# Patient Record
Sex: Male | Born: 1970 | Hispanic: No | Smoking: Never smoker
Health system: Southern US, Community
[De-identification: ages and names within clinical notes are randomized; demographics above are authoritative.]

## PROBLEM LIST (undated history)

## (undated) DIAGNOSIS — F419 Anxiety disorder, unspecified: Secondary | ICD-10-CM

## (undated) DIAGNOSIS — M1990 Unspecified osteoarthritis, unspecified site: Secondary | ICD-10-CM

## (undated) DIAGNOSIS — F329 Major depressive disorder, single episode, unspecified: Secondary | ICD-10-CM

## (undated) DIAGNOSIS — I10 Essential (primary) hypertension: Secondary | ICD-10-CM

## (undated) DIAGNOSIS — Z9889 Other specified postprocedural states: Secondary | ICD-10-CM

## (undated) DIAGNOSIS — F32A Depression, unspecified: Secondary | ICD-10-CM

## (undated) DIAGNOSIS — J189 Pneumonia, unspecified organism: Secondary | ICD-10-CM

## (undated) DIAGNOSIS — Z87442 Personal history of urinary calculi: Secondary | ICD-10-CM

## (undated) DIAGNOSIS — G4730 Sleep apnea, unspecified: Secondary | ICD-10-CM

## (undated) DIAGNOSIS — R112 Nausea with vomiting, unspecified: Secondary | ICD-10-CM

## (undated) HISTORY — PX: IRRIGATION AND DEBRIDEMENT SEBACEOUS CYST: SHX5255

## (undated) HISTORY — PX: WISDOM TOOTH EXTRACTION: SHX21

## (undated) HISTORY — PX: ESOPHAGOGASTRODUODENOSCOPY: SHX1529

## (undated) HISTORY — PX: COLONOSCOPY: SHX174

## (undated) HISTORY — PX: GASTRIC BYPASS: SHX52

---

## 2010-09-02 ENCOUNTER — Emergency Department (HOSPITAL_BASED_OUTPATIENT_CLINIC_OR_DEPARTMENT_OTHER)
Admission: EM | Admit: 2010-09-02 | Discharge: 2010-09-02 | Payer: Self-pay | Source: Home / Self Care | Admitting: Emergency Medicine

## 2011-09-07 ENCOUNTER — Emergency Department (HOSPITAL_BASED_OUTPATIENT_CLINIC_OR_DEPARTMENT_OTHER)
Admission: EM | Admit: 2011-09-07 | Discharge: 2011-09-07 | Disposition: A | Discharge status: Home / Self Care | Payer: BC Managed Care – PPO | Attending: Emergency Medicine | Admitting: Emergency Medicine

## 2011-09-07 ENCOUNTER — Emergency Department (INDEPENDENT_AMBULATORY_CARE_PROVIDER_SITE_OTHER): Payer: BC Managed Care – PPO

## 2011-09-07 ENCOUNTER — Encounter (HOSPITAL_BASED_OUTPATIENT_CLINIC_OR_DEPARTMENT_OTHER): Payer: Self-pay | Admitting: *Deleted

## 2011-09-07 ENCOUNTER — Other Ambulatory Visit: Payer: Self-pay

## 2011-09-07 DIAGNOSIS — R0989 Other specified symptoms and signs involving the circulatory and respiratory systems: Secondary | ICD-10-CM | POA: Insufficient documentation

## 2011-09-07 DIAGNOSIS — R0600 Dyspnea, unspecified: Secondary | ICD-10-CM

## 2011-09-07 DIAGNOSIS — R0609 Other forms of dyspnea: Secondary | ICD-10-CM | POA: Insufficient documentation

## 2011-09-07 DIAGNOSIS — X020XXA Exposure to flames in controlled fire in building or structure, initial encounter: Secondary | ICD-10-CM

## 2011-09-07 DIAGNOSIS — I10 Essential (primary) hypertension: Secondary | ICD-10-CM | POA: Insufficient documentation

## 2011-09-07 DIAGNOSIS — R0602 Shortness of breath: Secondary | ICD-10-CM | POA: Insufficient documentation

## 2011-09-07 DIAGNOSIS — Z79899 Other long term (current) drug therapy: Secondary | ICD-10-CM | POA: Insufficient documentation

## 2011-09-07 HISTORY — DX: Essential (primary) hypertension: I10
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
## 2011-09-07 NOTE — ED Provider Notes (Signed)
Medical screening examination/treatment/procedure(s) were performed by non-physician practitioner and as supervising physician I was immediately available for consultation/collaboration.   Leigh-Ann Sheba Whaling, MD 09/07/11 2358 
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
## 2017-12-31 ENCOUNTER — Other Ambulatory Visit: Payer: Self-pay | Admitting: Neurological Surgery

## 2018-01-08 NOTE — Pre-Procedure Instructions (Signed)
Steven Crosby  01/08/2018      [id number] - [place], [place] - [address] [place]  [address] Phone: [phone] Fax: [phone]  [facility] - [place], [place] - [address] [place] [address] Phone: [phone] Fax: [phone]  [facility] [address] [place] - [address] [place] [address] Phone: [phone] Fax: [phone]    Your procedure is scheduled on  Wednesday  01/14/18  Report to [facility] Admitting at 630 A.M.  Call this number if you have problems the morning of surgery:  [phone]   Remember:  No food  OR DRINK after midnight.     Take these medicines the morning of surgery with A SIP OF WATER   BUSPIRONE (BUSPAR), ESCITALOPRAM (LEXAPRO), GABAPENTIN, EYE DROPS, METOPROLOL (LOPRESSOR), OMEPRAZOLE (PRILOSEC)  7 days prior to surgery STOP taking any Aspirin(unless otherwise instructed by your surgeon), Aleve, Naproxen, Ibuprofen, Motrin, Advil, Goody's, BC's, all herbal medications, fish oil, and all vitamins    Do not wear jewelry, make-up or nail polish.  Do not wear lotions, powders, or perfumes, or deodorant.  Do not shave 48 hours prior to surgery.  Men may shave face and neck.  Do not bring valuables to the hospital.  [facility] is not responsible for any belongings or valuables.  Contacts, dentures or bridgework may not be worn into surgery.  Leave your suitcase in the car.  After surgery it may be brought to your room.  For patients admitted to the hospital, discharge time will be determined by your treatment team.  Patients discharged the day of surgery will not be allowed to drive home.   Name and phone number of your driver:    Special instructions:  Steven Crosby - Preparing for Surgery  Before surgery, you can play an important  role.  Because skin is not sterile, your skin needs to be as free of germs as possible.  You can reduce the number of germs on you skin by washing with CHG (chlorahexidine gluconate) soap before surgery.  CHG is an antiseptic cleaner which kills germs and bonds with the skin to continue killing germs even after washing.  Oral Hygiene is also important in reducing the risk of infection.  Remember to brush your teeth with your regular toothpaste the morning of surgery.  Please DO NOT use if you have an allergy to CHG or antibacterial soaps.  If your skin becomes reddened/irritated stop using the CHG and inform your nurse when you arrive at Short Stay.  Do not shave (including legs and underarms) for at least 48 hours prior to the first CHG shower.  You may shave your face.  Please follow these instructions carefully:   1.  Shower with CHG Soap the night before surgery and the morning of Surgery.  2.  If you choose to wash your hair, wash your hair first as usual with your normal shampoo.  3.  After you shampoo, rinse your hair and body thoroughly to remove the shampoo. 4.  Use CHG as you would any other liquid soap.  You can apply chg directly to the skin and wash gently with a      scrungie or washcloth.           5.  Apply the CHG  Soap to your body ONLY FROM THE NECK DOWN.   Do not use on open wounds or open sores. Avoid contact with your eyes, ears, mouth and genitals (private parts).  Wash genitals (private parts) with your normal soap.  6.  Wash thoroughly, paying special attention to the area where your surgery will be performed.  7.  Thoroughly rinse your body with warm water from the neck down.  8.  DO NOT shower/wash with your normal soap after using and rinsing off the CHG Soap.  9.  Pat yourself dry with a clean towel.            10.  Wear clean pajamas.            11.  Place clean sheets on your bed the night of your first shower and do not sleep with pets.  Day of Surgery  Do not  apply any lotions/deoderants the morning of surgery.   Please wear clean clothes to the hospital/surgery center. Remember to brush your teeth with toothpaste.     Please read over the following fact sheets that you were given. MRSA Information and Surgical Site Infection Prevention

## 2018-01-09 ENCOUNTER — Ambulatory Visit (HOSPITAL_COMMUNITY)
Admission: RE | Admit: 2018-01-09 | Discharge: 2018-01-09 | Disposition: A | Discharge status: Home / Self Care | Payer: Self-pay | Source: Ambulatory Visit | Attending: Neurological Surgery | Admitting: Neurological Surgery

## 2018-01-09 ENCOUNTER — Encounter (HOSPITAL_COMMUNITY)
Admission: RE | Admit: 2018-01-09 | Discharge: 2018-01-09 | Disposition: A | Discharge status: Home / Self Care | Payer: Worker's Compensation | Source: Ambulatory Visit | Attending: Neurological Surgery | Admitting: Neurological Surgery

## 2018-01-09 ENCOUNTER — Encounter (HOSPITAL_COMMUNITY): Payer: Self-pay

## 2018-01-09 ENCOUNTER — Other Ambulatory Visit: Payer: Self-pay

## 2018-01-09 DIAGNOSIS — Z01812 Encounter for preprocedural laboratory examination: Secondary | ICD-10-CM | POA: Insufficient documentation

## 2018-01-09 DIAGNOSIS — Z01810 Encounter for preprocedural cardiovascular examination: Secondary | ICD-10-CM | POA: Insufficient documentation

## 2018-01-09 DIAGNOSIS — M4802 Spinal stenosis, cervical region: Secondary | ICD-10-CM | POA: Insufficient documentation

## 2018-01-09 HISTORY — DX: Other specified postprocedural states: Z98.890

## 2018-01-09 HISTORY — DX: Unspecified osteoarthritis, unspecified site: M19.90

## 2018-01-09 HISTORY — DX: Depression, unspecified: F32.A

## 2018-01-09 HISTORY — DX: Pneumonia, unspecified organism: J18.9

## 2018-01-09 HISTORY — DX: Other specified postprocedural states: R11.2

## 2018-01-09 HISTORY — DX: Anxiety disorder, unspecified: F41.9

## 2018-01-09 HISTORY — DX: Personal history of urinary calculi: Z87.442

## 2018-01-09 HISTORY — DX: Sleep apnea, unspecified: G47.30

## 2018-01-09 HISTORY — DX: Major depressive disorder, single episode, unspecified: F32.9

## 2018-01-09 LAB — CBC WITH DIFFERENTIAL/PLATELET
ABS IMMATURE GRANULOCYTES: 0 10*3/uL (ref 0.0–0.1)
Basophils Absolute: 0.1 10*3/uL (ref 0.0–0.1)
Basophils Relative: 1 %
EOS ABS: 0.1 10*3/uL (ref 0.0–0.7)
Eosinophils Relative: 2 %
HEMATOCRIT: 42 % (ref 39.0–52.0)
Hemoglobin: 13.8 g/dL (ref 13.0–17.0)
IMMATURE GRANULOCYTES: 0 %
LYMPHS ABS: 2.2 10*3/uL (ref 0.7–4.0)
Lymphocytes Relative: 24 %
MCH: 28.3 pg (ref 26.0–34.0)
MCHC: 32.9 g/dL (ref 30.0–36.0)
MCV: 86.1 fL (ref 78.0–100.0)
MONOS PCT: 7 %
Monocytes Absolute: 0.7 10*3/uL (ref 0.1–1.0)
NEUTROS ABS: 5.9 10*3/uL (ref 1.7–7.7)
NEUTROS PCT: 66 %
Platelets: 292 10*3/uL (ref 150–400)
RBC: 4.88 MIL/uL (ref 4.22–5.81)
RDW: 13.5 % (ref 11.5–15.5)
WBC: 8.9 10*3/uL (ref 4.0–10.5)

## 2018-01-09 LAB — BASIC METABOLIC PANEL
Anion gap: 9 (ref 5–15)
BUN: 15 mg/dL (ref 6–20)
CHLORIDE: 107 mmol/L (ref 101–111)
CO2: 23 mmol/L (ref 22–32)
CREATININE: 0.79 mg/dL (ref 0.61–1.24)
Calcium: 8.9 mg/dL (ref 8.9–10.3)
GFR calc non Af Amer: >60 mL/min (ref >60)
Glucose, Bld: 82 mg/dL (ref 65–99)
Potassium: 4.1 mmol/L (ref 3.5–5.1)
Sodium: 139 mmol/L (ref 135–145)

## 2018-01-09 LAB — SURGICAL PCR SCREEN
MRSA, PCR: NEGATIVE
Staphylococcus aureus: NEGATIVE

## 2018-01-09 LAB — PROTIME-INR
INR: 1.04
Prothrombin Time: 13.5 seconds (ref 11.4–15.2)

## 2018-01-09 NOTE — Progress Notes (Addendum)
PCP is Dr Daphine Deutscher at [facility] States he saw a cardiologist many years ago and only wore a heart monitor states everything was fine - Dr Desma Maxim Denies chest pain,cough, or fever. Instructed to bring in CPAP mask on the day of surgery. Denies ever having a card cath, stress test, or echo.

## 2018-01-13 MED ORDER — DEXTROSE 5 % IV SOLN
3.0000 g | INTRAVENOUS | Status: AC
  Administered 2018-01-14: 3 g via INTRAVENOUS
  Filled 2018-01-13: qty 3

## 2018-01-14 ENCOUNTER — Observation Stay (HOSPITAL_COMMUNITY)
Admission: RE | Admit: 2018-01-14 | Discharge: 2018-01-14 | Disposition: A | Discharge status: Home / Self Care | Payer: Worker's Compensation | Source: Ambulatory Visit | Attending: Neurological Surgery | Admitting: Neurological Surgery

## 2018-01-14 ENCOUNTER — Other Ambulatory Visit: Payer: Self-pay

## 2018-01-14 ENCOUNTER — Ambulatory Visit (HOSPITAL_COMMUNITY): Payer: Worker's Compensation | Admitting: Certified Registered Nurse Anesthetist

## 2018-01-14 ENCOUNTER — Encounter (HOSPITAL_COMMUNITY): Payer: Self-pay | Admitting: *Deleted

## 2018-01-14 ENCOUNTER — Ambulatory Visit (HOSPITAL_COMMUNITY): Payer: Worker's Compensation

## 2018-01-14 ENCOUNTER — Encounter (HOSPITAL_COMMUNITY)
Admission: RE | Disposition: A | Discharge status: Home / Self Care | Payer: Self-pay | Source: Ambulatory Visit | Attending: Neurological Surgery

## 2018-01-14 ENCOUNTER — Ambulatory Visit (HOSPITAL_COMMUNITY): Payer: Worker's Compensation | Admitting: Emergency Medicine

## 2018-01-14 DIAGNOSIS — F419 Anxiety disorder, unspecified: Secondary | ICD-10-CM | POA: Insufficient documentation

## 2018-01-14 DIAGNOSIS — Z87891 Personal history of nicotine dependence: Secondary | ICD-10-CM | POA: Diagnosis not present

## 2018-01-14 DIAGNOSIS — Z79899 Other long term (current) drug therapy: Secondary | ICD-10-CM | POA: Insufficient documentation

## 2018-01-14 DIAGNOSIS — F329 Major depressive disorder, single episode, unspecified: Secondary | ICD-10-CM | POA: Diagnosis not present

## 2018-01-14 DIAGNOSIS — G4730 Sleep apnea, unspecified: Secondary | ICD-10-CM | POA: Insufficient documentation

## 2018-01-14 DIAGNOSIS — I10 Essential (primary) hypertension: Secondary | ICD-10-CM | POA: Diagnosis not present

## 2018-01-14 DIAGNOSIS — M4722 Other spondylosis with radiculopathy, cervical region: Secondary | ICD-10-CM | POA: Insufficient documentation

## 2018-01-14 DIAGNOSIS — M50123 Cervical disc disorder at C6-C7 level with radiculopathy: Principal | ICD-10-CM | POA: Insufficient documentation

## 2018-01-14 DIAGNOSIS — M4322 Fusion of spine, cervical region: Secondary | ICD-10-CM | POA: Diagnosis present

## 2018-01-14 DIAGNOSIS — Z419 Encounter for procedure for purposes other than remedying health state, unspecified: Secondary | ICD-10-CM

## 2018-01-14 HISTORY — PX: ANTERIOR CERVICAL DECOMP/DISCECTOMY FUSION: SHX1161

## 2018-01-14 SURGERY — ANTERIOR CERVICAL DECOMPRESSION/DISCECTOMY FUSION 2 LEVELS
Anesthesia: General | Site: Spine Cervical

## 2018-01-14 MED ORDER — SODIUM CHLORIDE 0.9 % IV SOLN: 250.0000 mL | INTRAVENOUS | Status: DC

## 2018-01-14 MED ORDER — MIDAZOLAM HCL 5 MG/5ML IJ SOLN
INTRAMUSCULAR | Status: DC | PRN
  Administered 2018-01-14: 2 mg via INTRAVENOUS

## 2018-01-14 MED ORDER — GLYCOPYRROLATE PF 0.2 MG/ML IJ SOSY
PREFILLED_SYRINGE | INTRAMUSCULAR | Status: DC | PRN
  Administered 2018-01-14: 0.8 mg via INTRAVENOUS

## 2018-01-14 MED ORDER — THROMBIN 5000 UNITS EX SOLR
CUTANEOUS | Status: AC
  Filled 2018-01-14: qty 5000

## 2018-01-14 MED ORDER — BUPIVACAINE HCL (PF) 0.25 % IJ SOLN
INTRAMUSCULAR | Status: AC
  Filled 2018-01-14: qty 30

## 2018-01-14 MED ORDER — LIDOCAINE 2% (20 MG/ML) 5 ML SYRINGE
INTRAMUSCULAR | Status: AC
  Filled 2018-01-14: qty 5

## 2018-01-14 MED ORDER — METHOCARBAMOL 1000 MG/10ML IJ SOLN
500.0000 mg | Freq: Four times a day (QID) | INTRAMUSCULAR | Status: DC | PRN
  Filled 2018-01-14: qty 5

## 2018-01-14 MED ORDER — SODIUM CHLORIDE 0.9% FLUSH: 3.0000 mL | INTRAVENOUS | Status: DC | PRN

## 2018-01-14 MED ORDER — FENTANYL CITRATE (PF) 250 MCG/5ML IJ SOLN
INTRAMUSCULAR | Status: AC
  Filled 2018-01-14: qty 5

## 2018-01-14 MED ORDER — ESCITALOPRAM OXALATE 20 MG PO TABS: 20.0000 mg | ORAL_TABLET | Freq: Every day | ORAL | Status: DC

## 2018-01-14 MED ORDER — ONDANSETRON HCL 4 MG PO TABS: 4.0000 mg | ORAL_TABLET | Freq: Four times a day (QID) | ORAL | Status: DC | PRN

## 2018-01-14 MED ORDER — MENTHOL 3 MG MT LOZG: 1.0000 | LOZENGE | OROMUCOSAL | Status: DC | PRN

## 2018-01-14 MED ORDER — CEFAZOLIN SODIUM-DEXTROSE 2-4 GM/100ML-% IV SOLN
2.0000 g | Freq: Three times a day (TID) | INTRAVENOUS | Status: DC
  Administered 2018-01-14: 2 g via INTRAVENOUS
  Filled 2018-01-14: qty 100

## 2018-01-14 MED ORDER — PROPOFOL 10 MG/ML IV BOLUS
INTRAVENOUS | Status: DC | PRN
  Administered 2018-01-14: 200 mg via INTRAVENOUS

## 2018-01-14 MED ORDER — NEOSTIGMINE METHYLSULFATE 5 MG/5ML IV SOSY
PREFILLED_SYRINGE | INTRAVENOUS | Status: AC
  Filled 2018-01-14: qty 5

## 2018-01-14 MED ORDER — PROPOFOL 10 MG/ML IV BOLUS
INTRAVENOUS | Status: AC
  Filled 2018-01-14: qty 40

## 2018-01-14 MED ORDER — OXYCODONE HCL 5 MG PO TABS: 5.0000 mg | ORAL_TABLET | Freq: Once | ORAL | Status: DC | PRN

## 2018-01-14 MED ORDER — HYDROMORPHONE HCL 1 MG/ML IJ SOLN
0.5000 mg | INTRAMUSCULAR | Status: DC | PRN
  Administered 2018-01-14: 0.5 mg via INTRAVENOUS
  Filled 2018-01-14: qty 0.5

## 2018-01-14 MED ORDER — ONDANSETRON HCL 4 MG/2ML IJ SOLN: 4.0000 mg | Freq: Four times a day (QID) | INTRAMUSCULAR | Status: DC | PRN

## 2018-01-14 MED ORDER — DEXAMETHASONE SODIUM PHOSPHATE 10 MG/ML IJ SOLN
INTRAMUSCULAR | Status: AC
  Filled 2018-01-14: qty 1

## 2018-01-14 MED ORDER — SODIUM CHLORIDE 0.9 % IV SOLN
INTRAVENOUS | Status: DC | PRN
  Administered 2018-01-14: 500 mL

## 2018-01-14 MED ORDER — BUPIVACAINE HCL (PF) 0.25 % IJ SOLN
INTRAMUSCULAR | Status: DC | PRN
  Administered 2018-01-14: 7 mL

## 2018-01-14 MED ORDER — ACETAMINOPHEN 325 MG PO TABS: 325.0000 mg | ORAL_TABLET | ORAL | Status: DC | PRN

## 2018-01-14 MED ORDER — DEXAMETHASONE SODIUM PHOSPHATE 10 MG/ML IJ SOLN
INTRAMUSCULAR | Status: DC | PRN
  Administered 2018-01-14: 10 mg via INTRAVENOUS

## 2018-01-14 MED ORDER — ACETAMINOPHEN 160 MG/5ML PO SOLN: 325.0000 mg | ORAL | Status: DC | PRN

## 2018-01-14 MED ORDER — SENNA 8.6 MG PO TABS: 1.0000 | ORAL_TABLET | Freq: Two times a day (BID) | ORAL | Status: DC

## 2018-01-14 MED ORDER — PHENOL 1.4 % MT LIQD: 1.0000 | OROMUCOSAL | Status: DC | PRN

## 2018-01-14 MED ORDER — MIDAZOLAM HCL 2 MG/2ML IJ SOLN
INTRAMUSCULAR | Status: AC
  Filled 2018-01-14: qty 2

## 2018-01-14 MED ORDER — METOPROLOL TARTRATE 100 MG PO TABS: 100.0000 mg | ORAL_TABLET | Freq: Every day | ORAL | Status: DC

## 2018-01-14 MED ORDER — EPHEDRINE SULFATE 50 MG/ML IJ SOLN
INTRAMUSCULAR | Status: AC
  Filled 2018-01-14: qty 1

## 2018-01-14 MED ORDER — POTASSIUM CHLORIDE IN NACL 20-0.9 MEQ/L-% IV SOLN: INTRAVENOUS | Status: DC

## 2018-01-14 MED ORDER — ACETAMINOPHEN 325 MG PO TABS: 650.0000 mg | ORAL_TABLET | ORAL | Status: DC | PRN

## 2018-01-14 MED ORDER — METHOCARBAMOL 500 MG PO TABS
500.0000 mg | ORAL_TABLET | Freq: Four times a day (QID) | ORAL | Status: DC | PRN
  Administered 2018-01-14: 500 mg via ORAL
  Filled 2018-01-14: qty 1

## 2018-01-14 MED ORDER — LACTATED RINGERS IV SOLN
INTRAVENOUS | Status: DC | PRN
  Administered 2018-01-14 (×2): via INTRAVENOUS

## 2018-01-14 MED ORDER — ONDANSETRON HCL 4 MG/2ML IJ SOLN
INTRAMUSCULAR | Status: DC | PRN
  Administered 2018-01-14: 4 mg via INTRAVENOUS

## 2018-01-14 MED ORDER — CEFAZOLIN SODIUM-DEXTROSE 2-4 GM/100ML-% IV SOLN
INTRAVENOUS | Status: AC
  Filled 2018-01-14: qty 100

## 2018-01-14 MED ORDER — NEOSTIGMINE METHYLSULFATE 5 MG/5ML IV SOSY
PREFILLED_SYRINGE | INTRAVENOUS | Status: DC | PRN
  Administered 2018-01-14: 5 mg via INTRAVENOUS

## 2018-01-14 MED ORDER — HYDROCODONE-ACETAMINOPHEN 10-325 MG PO TABS
1.0000 | ORAL_TABLET | ORAL | Status: DC | PRN
  Administered 2018-01-14: 1 via ORAL
  Filled 2018-01-14 (×2): qty 1

## 2018-01-14 MED ORDER — BUSPIRONE HCL 10 MG PO TABS: 10.0000 mg | ORAL_TABLET | Freq: Every day | ORAL | Status: DC

## 2018-01-14 MED ORDER — OXYCODONE HCL 5 MG/5ML PO SOLN: 5.0000 mg | Freq: Once | ORAL | Status: DC | PRN

## 2018-01-14 MED ORDER — GABAPENTIN 300 MG PO CAPS
300.0000 mg | ORAL_CAPSULE | Freq: Three times a day (TID) | ORAL | Status: DC
  Administered 2018-01-14: 300 mg via ORAL
  Filled 2018-01-14: qty 1

## 2018-01-14 MED ORDER — 0.9 % SODIUM CHLORIDE (POUR BTL) OPTIME
TOPICAL | Status: DC | PRN
  Administered 2018-01-14: 1000 mL

## 2018-01-14 MED ORDER — SODIUM CHLORIDE 0.9% FLUSH
3.0000 mL | Freq: Two times a day (BID) | INTRAVENOUS | Status: DC
  Administered 2018-01-14: 3 mL via INTRAVENOUS

## 2018-01-14 MED ORDER — EPHEDRINE SULFATE 50 MG/ML IJ SOLN
INTRAMUSCULAR | Status: DC | PRN
  Administered 2018-01-14 (×2): 10 mg via INTRAVENOUS

## 2018-01-14 MED ORDER — ROCURONIUM BROMIDE 10 MG/ML (PF) SYRINGE
PREFILLED_SYRINGE | INTRAVENOUS | Status: AC
  Filled 2018-01-14: qty 10

## 2018-01-14 MED ORDER — LIDOCAINE 2% (20 MG/ML) 5 ML SYRINGE
INTRAMUSCULAR | Status: DC | PRN
  Administered 2018-01-14: 40 mg via INTRAVENOUS
  Administered 2018-01-14: 100 mg via INTRAVENOUS

## 2018-01-14 MED ORDER — ACETAMINOPHEN 650 MG RE SUPP: 650.0000 mg | RECTAL | Status: DC | PRN

## 2018-01-14 MED ORDER — FENTANYL CITRATE (PF) 100 MCG/2ML IJ SOLN
25.0000 ug | INTRAMUSCULAR | Status: DC | PRN
  Administered 2018-01-14 (×2): 25 ug via INTRAVENOUS

## 2018-01-14 MED ORDER — FENTANYL CITRATE (PF) 100 MCG/2ML IJ SOLN
INTRAMUSCULAR | Status: DC | PRN
  Administered 2018-01-14: 100 ug via INTRAVENOUS
  Administered 2018-01-14: 50 ug via INTRAVENOUS
  Administered 2018-01-14: 100 ug via INTRAVENOUS
  Administered 2018-01-14 (×3): 50 ug via INTRAVENOUS

## 2018-01-14 MED ORDER — HYDROCODONE-ACETAMINOPHEN 10-325 MG PO TABS
1.0000 | ORAL_TABLET | ORAL | Status: DC | PRN
  Administered 2018-01-14: 1 via ORAL

## 2018-01-14 MED ORDER — ONDANSETRON HCL 4 MG/2ML IJ SOLN
INTRAMUSCULAR | Status: AC
  Filled 2018-01-14: qty 2

## 2018-01-14 MED ORDER — MODAFINIL 200 MG PO TABS: 200.0000 mg | ORAL_TABLET | Freq: Every day | ORAL | Status: DC

## 2018-01-14 MED ORDER — FENTANYL CITRATE (PF) 100 MCG/2ML IJ SOLN
INTRAMUSCULAR | Status: AC
  Filled 2018-01-14: qty 2

## 2018-01-14 MED ORDER — ROCURONIUM BROMIDE 10 MG/ML (PF) SYRINGE
PREFILLED_SYRINGE | INTRAVENOUS | Status: DC | PRN
  Administered 2018-01-14: 70 mg via INTRAVENOUS
  Administered 2018-01-14: 30 mg via INTRAVENOUS

## 2018-01-14 MED ORDER — THROMBIN 5000 UNITS EX SOLR
OROMUCOSAL | Status: DC | PRN
  Administered 2018-01-14: 5 mL via TOPICAL

## 2018-01-14 SURGICAL SUPPLY — 52 items
BAG DECANTER FOR FLEXI CONT (MISCELLANEOUS) ×3 IMPLANT
BASKET BONE COLLECTION (BASKET) ×3 IMPLANT
BENZOIN TINCTURE PRP APPL 2/3 (GAUZE/BANDAGES/DRESSINGS) ×3 IMPLANT
BIT DRILL 14MM (INSTRUMENTS) ×1 IMPLANT
BUR MATCHSTICK NEURO 3.0 LAGG (BURR) ×3 IMPLANT
CANISTER SUCT 3000ML PPV (MISCELLANEOUS) ×3 IMPLANT
CARTRIDGE OIL MAESTRO DRILL (MISCELLANEOUS) ×1 IMPLANT
CLOSURE WOUND 1/2 X4 (GAUZE/BANDAGES/DRESSINGS) ×1
DIFFUSER DRILL AIR PNEUMATIC (MISCELLANEOUS) ×3 IMPLANT
DRAPE C-ARM 42X72 X-RAY (DRAPES) ×6 IMPLANT
DRAPE LAPAROTOMY 100X72 PEDS (DRAPES) ×3 IMPLANT
DRAPE MICROSCOPE LEICA (MISCELLANEOUS) ×3 IMPLANT
DRILL 14MM (INSTRUMENTS) ×3
DRSG OPSITE POSTOP 4X6 (GAUZE/BANDAGES/DRESSINGS) ×3 IMPLANT
DURAPREP 6ML APPLICATOR 50/CS (WOUND CARE) ×3 IMPLANT
ELECT COATED BLADE 2.86 ST (ELECTRODE) ×3 IMPLANT
ELECT REM PT RETURN 9FT ADLT (ELECTROSURGICAL) ×3
ELECTRODE REM PT RTRN 9FT ADLT (ELECTROSURGICAL) ×1 IMPLANT
GAUZE SPONGE 4X4 16PLY XRAY LF (GAUZE/BANDAGES/DRESSINGS) IMPLANT
GLOVE BIO SURGEON STRL SZ7 (GLOVE) ×3 IMPLANT
GLOVE BIO SURGEON STRL SZ8 (GLOVE) ×3 IMPLANT
GLOVE BIOGEL PI IND STRL 7.0 (GLOVE) ×1 IMPLANT
GLOVE BIOGEL PI INDICATOR 7.0 (GLOVE) ×2
GLOVE SURG SS PI 7.5 STRL IVOR (GLOVE) ×15 IMPLANT
GOWN STRL REUS W/ TWL LRG LVL3 (GOWN DISPOSABLE) ×3 IMPLANT
GOWN STRL REUS W/ TWL XL LVL3 (GOWN DISPOSABLE) ×1 IMPLANT
GOWN STRL REUS W/TWL 2XL LVL3 (GOWN DISPOSABLE) IMPLANT
GOWN STRL REUS W/TWL LRG LVL3 (GOWN DISPOSABLE) ×6
GOWN STRL REUS W/TWL XL LVL3 (GOWN DISPOSABLE) ×2
HEMOSTAT POWDER KIT SURGIFOAM (HEMOSTASIS) ×3 IMPLANT
KIT BASIN OR (CUSTOM PROCEDURE TRAY) ×3 IMPLANT
KIT TURNOVER KIT B (KITS) ×3 IMPLANT
NEEDLE HYPO 25X1 1.5 SAFETY (NEEDLE) ×3 IMPLANT
NEEDLE SPNL 20GX3.5 QUINCKE YW (NEEDLE) ×3 IMPLANT
NS IRRIG 1000ML POUR BTL (IV SOLUTION) ×3 IMPLANT
OIL CARTRIDGE MAESTRO DRILL (MISCELLANEOUS) ×3
PACK LAMINECTOMY NEURO (CUSTOM PROCEDURE TRAY) ×3 IMPLANT
PAD ARMBOARD 7.5X6 YLW CONV (MISCELLANEOUS) ×12 IMPLANT
PIN DISTRACTION 14MM (PIN) ×6 IMPLANT
PLATE 34MM (Plate) ×3 IMPLANT
RUBBERBAND STERILE (MISCELLANEOUS) ×6 IMPLANT
SCREW 16MM (Screw) ×18 IMPLANT
SPACER PTI-C 7X16X14 IDENTI (Spacer) ×3 IMPLANT
SPACER PTI-C 8X16X14 7DEG (Spacer) ×3 IMPLANT
SPONGE INTESTINAL PEANUT (DISPOSABLE) ×3 IMPLANT
SPONGE SURGIFOAM ABS GEL SZ50 (HEMOSTASIS) IMPLANT
STRIP CLOSURE SKIN 1/2X4 (GAUZE/BANDAGES/DRESSINGS) ×2 IMPLANT
SUT VIC AB 3-0 SH 8-18 (SUTURE) ×6 IMPLANT
SUT VIC AB 4-0 PS2 27 (SUTURE) ×3 IMPLANT
TOWEL GREEN STERILE (TOWEL DISPOSABLE) ×3 IMPLANT
TOWEL GREEN STERILE FF (TOWEL DISPOSABLE) ×3 IMPLANT
WATER STERILE IRR 1000ML POUR (IV SOLUTION) ×3 IMPLANT

## 2018-01-14 NOTE — Discharge Summary (Signed)
Physician Discharge Summary  Patient ID: Steven ClinesScott R Crosby MRN: [id number] DOB/AGE: 47/04/1971 47 y.o.  Admit date: 01/14/2018 Discharge date: 01/14/2018  Admission Diagnoses:cervical spondylosis    Discharge Diagnoses: same   Discharged Condition: good  Hospital Course: The patient was admitted on 01/14/2018 and taken to the operating room where the patient underwent acdf c5-6, 6-7. The patient tolerated the procedure well and was taken to the recovery room and then to the floor in stable condition. The hospital course was routine. There were no complications. The wound remained clean dry and intact. Pt had appropriate back soreness. No complaints of arm pain or new N/T/W. The patient remained afebrile with stable vital signs, and tolerated a regular diet. The patient continued to increase activities, and pain was well controlled with oral pain medications.   Consults: none  Significant Diagnostic Studies:  Results for orders placed or performed during the hospital encounter of 01/09/18  Surgical pcr screen  Result Value Ref Range   MRSA, PCR NEGATIVE NEGATIVE   Staphylococcus aureus NEGATIVE NEGATIVE  Basic metabolic panel  Result Value Ref Range   Sodium 139 135 - 145 mmol/L   Potassium 4.1 3.5 - 5.1 mmol/L   Chloride 107 101 - 111 mmol/L   CO2 23 22 - 32 mmol/L   Glucose, Bld 82 65 - 99 mg/dL   BUN 15 6 - 20 mg/dL   Creatinine, Ser 1.190.79 0.61 - 1.24 mg/dL   Calcium 8.9 8.9 - 14.710.3 mg/dL   GFR calc non Af Amer >60 >60 mL/min   GFR calc Af Amer >60 >60 mL/min   Anion gap 9 5 - 15  CBC WITH DIFFERENTIAL  Result Value Ref Range   WBC 8.9 4.0 - 10.5 K/uL   RBC 4.88 4.22 - 5.81 MIL/uL   Hemoglobin 13.8 13.0 - 17.0 g/dL   HCT 82.942.0 56.239.0 - 13.052.0 %   MCV 86.1 78.0 - 100.0 fL   MCH 28.3 26.0 - 34.0 pg   MCHC 32.9 30.0 - 36.0 g/dL   RDW 86.513.5 78.411.5 - 69.615.5 %   Platelets 292 150 - 400 K/uL   Neutrophils Relative % 66 %   Neutro Abs 5.9 1.7 - 7.7 K/uL   Lymphocytes Relative 24 %   Lymphs  Abs 2.2 0.7 - 4.0 K/uL   Monocytes Relative 7 %   Monocytes Absolute 0.7 0.1 - 1.0 K/uL   Eosinophils Relative 2 %   Eosinophils Absolute 0.1 0.0 - 0.7 K/uL   Basophils Relative 1 %   Basophils Absolute 0.1 0.0 - 0.1 K/uL   Immature Granulocytes 0 %   Abs Immature Granulocytes 0.0 0.0 - 0.1 K/uL  Protime-INR  Result Value Ref Range   Prothrombin Time 13.5 11.4 - 15.2 seconds   INR 1.04     Chest 2 View  Result Date: 01/10/2018 CLINICAL DATA:  Preop for cervical stenosis. EXAM: CHEST - 2 VIEW COMPARISON:  09/07/2011 FINDINGS: Heart, mediastinum and hila are within normal limits. The lungs are clear.  No pleural effusion or pneumothorax. Skeletal structures are intact. IMPRESSION: No active cardiopulmonary disease. Electronically Signed   By: Amie Portlandavid  Ormond M.D.   On: 01/10/2018 09:36   Dg Cervical Spine 1 View  Result Date: 01/14/2018 CLINICAL DATA:  Anterior cervical decompression. EXAM: DG CERVICAL SPINE - 1 VIEW; DG C-ARM 61-120 MIN COMPARISON:  None. FINDINGS: 2 portable cross-table lateral spot fluoro films of the cervical spine obtained. The show anterior cervical discectomy at C5-6 and C6-7 with anterior plate extending  from the C5 level down to C7. Bony anatomy at these levels not clearly demonstrated due to superimposition of the patient's shoulders. Endotracheal tube noted in situ. IMPRESSION: Intraoperative assessment during ACDF with plating from C5-C7. Electronically Signed   By: Kennith Center M.D.   On: 01/14/2018 11:48   Dg C-arm 1-60 Min  Result Date: 01/14/2018 CLINICAL DATA:  Anterior cervical decompression. EXAM: DG CERVICAL SPINE - 1 VIEW; DG C-ARM 61-120 MIN COMPARISON:  None. FINDINGS: 2 portable cross-table lateral spot fluoro films of the cervical spine obtained. The show anterior cervical discectomy at C5-6 and C6-7 with anterior plate extending from the C5 level down to C7. Bony anatomy at these levels not clearly demonstrated due to superimposition of the patient's  shoulders. Endotracheal tube noted in situ. IMPRESSION: Intraoperative assessment during ACDF with plating from C5-C7. Electronically Signed   By: Kennith Center M.D.   On: 01/14/2018 11:48   Dg C-arm 1-60 Min  Result Date: 01/14/2018 CLINICAL DATA:  Anterior cervical decompression. EXAM: DG CERVICAL SPINE - 1 VIEW; DG C-ARM 61-120 MIN COMPARISON:  None. FINDINGS: 2 portable cross-table lateral spot fluoro films of the cervical spine obtained. The show anterior cervical discectomy at C5-6 and C6-7 with anterior plate extending from the C5 level down to C7. Bony anatomy at these levels not clearly demonstrated due to superimposition of the patient's shoulders. Endotracheal tube noted in situ. IMPRESSION: Intraoperative assessment during ACDF with plating from C5-C7. Electronically Signed   By: Kennith Center M.D.   On: 01/14/2018 11:48    Antibiotics:  Anti-infectives (From admission, onward)   Start     Dose/Rate Route Frequency Ordered Stop   01/14/18 1630  ceFAZolin (ANCEF) IVPB 2g/100 mL premix     2 g 200 mL/hr over 30 Minutes Intravenous Every 8 hours 01/14/18 1315 01/15/18 0829   01/14/18 0955  bacitracin 50,000 Units in sodium chloride 0.9 % 500 mL irrigation  Status:  Discontinued       As needed 01/14/18 0955 01/14/18 1121   01/14/18 0715  ceFAZolin (ANCEF) 3 g in dextrose 5 % 50 mL IVPB     3 g 100 mL/hr over 30 Minutes Intravenous On call to O.R. 01/13/18 1044 01/14/18 0830   01/14/18 0714  ceFAZolin (ANCEF) 2-4 GM/100ML-% IVPB    Note to Pharmacy:  [employer] [employer]   : cabinet override      01/14/18 0714 01/14/18 1914      Discharge Exam: Blood pressure 118/66, pulse 77, temperature 98.2 F (36.8 C), temperature source Oral, resp. rate 20, height 6\' 1"  (1.854 m), weight (!) 174.2 kg (384 lb), SpO2 94 %. Ambulating and voiding well, neuro intact  Discharge Medications:   Allergies as of 01/14/2018      Reactions   Nsaids Other (See Comments)   Due to gastric bypass surgery       Medication List    TAKE these medications   acetaminophen 500 MG tablet Commonly known as:  TYLENOL Take 1,000 mg by mouth once as needed for moderate pain or headache.   ARTIFICIAL TEARS OP Apply 1 drop to eye daily.   busPIRone 10 MG tablet Commonly known as:  BUSPAR Take 10 mg by mouth daily.   CALCIUM-MAGNESIUM-ZINC PO Take 1 tablet by mouth daily.   CRANBERRY PO Take 1 capsule by mouth daily.   escitalopram 20 MG tablet Commonly known as:  LEXAPRO Take 20 mg by mouth daily.   FIBER PO Take 1 tablet by mouth daily.  gabapentin 300 MG capsule Commonly known as:  NEURONTIN Take 300 mg by mouth 3 (three) times daily.   GLUCOSAMINE CHONDR COMPLEX PO Take 1 tablet by mouth daily.   IRON PO Take 1 tablet by mouth daily.   loratadine 10 MG tablet Commonly known as:  CLARITIN Take 10 mg by mouth daily.   metoprolol tartrate 100 MG tablet Commonly known as:  LOPRESSOR Take 100 mg by mouth daily.   modafinil 200 MG tablet Commonly known as:  PROVIGIL Take 200 mg by mouth daily.   MULTIVITAMIN ADULT PO Take 1 tablet by mouth daily.   omeprazole 20 MG capsule Commonly known as:  PRILOSEC Take 20 mg by mouth daily.   OVER THE COUNTER MEDICATION Take 1 tablet by mouth daily. Tumeric   PROBIOTIC DAILY PO Take 1 capsule by mouth daily.   VITAMIN B 12 PO Take 1 tablet by mouth daily.   VITAMIN D3 PO Take 1 tablet by mouth daily.       Disposition: home  Final Dx: acdf c5-6, 6-7  Discharge Instructions    Call MD for:  difficulty breathing, headache or visual disturbances   Complete by:  As directed    Call MD for:  extreme fatigue   Complete by:  As directed    Call MD for:  hives   Complete by:  As directed    Call MD for:  persistant dizziness or light-headedness   Complete by:  As directed    Call MD for:  persistant nausea and vomiting   Complete by:  As directed    Call MD for:  redness, tenderness, or signs of infection (pain,  swelling, redness, odor or green/yellow discharge around incision site)   Complete by:  As directed    Call MD for:  severe uncontrolled pain   Complete by:  As directed    Call MD for:  temperature >100.4   Complete by:  As directed    Diet - low sodium heart healthy   Complete by:  As directed    Increase activity slowly   Complete by:  As directed    Lifting restrictions   Complete by:  As directed    No lifting more than 8 lbs         Signed: Tiana Loft Alleen Kehm 01/14/2018, 7:20 PM

## 2018-01-14 NOTE — Anesthesia Preprocedure Evaluation (Signed)
Anesthesia Evaluation  Patient identified by MRN, date of birth, ID band Patient awake    Reviewed: Allergy & Precautions, NPO status , Patient's Chart, lab work & pertinent test results, reviewed documented beta blocker date and time   History of Anesthesia Complications (+) PONV and history of anesthetic complications  Airway Mallampati: IV  TM Distance: >3 FB Neck ROM: Full    Dental  (+) Teeth Intact   Pulmonary sleep apnea and Continuous Positive Airway Pressure Ventilation ,    breath sounds clear to auscultation       Cardiovascular hypertension, Pt. on medications and Pt. on home beta blockers (-) angina(-) Past MI and (-) CHF  Rhythm:Regular     Neuro/Psych PSYCHIATRIC DISORDERS Anxiety Depression  Neuromuscular disease    GI/Hepatic negative GI ROS, Neg liver ROS,   Endo/Other  Morbid obesity  Renal/GU negative Renal ROS     Musculoskeletal  (+) Arthritis ,   Abdominal   Peds  Hematology negative hematology ROS (+)   Anesthesia Other Findings   Reproductive/Obstetrics                             Anesthesia Physical Anesthesia Plan  ASA: III  Anesthesia Plan: General   Post-op Pain Management:    Induction: Intravenous  PONV Risk Score and Plan: 3 and Ondansetron and Dexamethasone  Airway Management Planned: Oral ETT  Additional Equipment: None  Intra-op Plan:   Post-operative Plan: Extubation in OR  Informed Consent: I have reviewed the patients History and Physical, chart, labs and discussed the procedure including the risks, benefits and alternatives for the proposed anesthesia with the patient or authorized representative who has indicated his/her understanding and acceptance.   Dental advisory given  Plan Discussed with: CRNA and Surgeon  Anesthesia Plan Comments:         Anesthesia Quick Evaluation

## 2018-01-14 NOTE — Op Note (Signed)
01/14/2018  11:23 AM  PATIENT:  Steven Crosby  47 y.o. male  PRE-OPERATIVE DIAGNOSIS:  Cervical spondylosis with cervical disc herniation C5-6 on the right C6-7  on the left, neck and bilateral arm pain  POST-OPERATIVE DIAGNOSIS:  same  PROCEDURE:  1. Decompressive anterior cervical discectomy C5-6 C6-7, 2. Anterior cervical arthrodesis C5-6 C6-7 utilizing a pti interbody cage packed with locally harvested morcellized autologous bone graft, 3. Anterior cervical plating C5-7 utilizing an atec plate  SURGEON:  Marikay Alaravid Dannia Snook, MD  ASSISTANTS: Meyran FNP  ANESTHESIA:   General  EBL: 100 ml  Total I/O In: 1000 [I.V.:1000] Out: 100 [Blood:100]  BLOOD ADMINISTERED: none  DRAINS: none  SPECIMEN:  none  INDICATION FOR PROCEDURE: This patient presented with severe back and bilateral left greater than right. Imaging showed cervical stenosis with cervical discoloration C5-6 and right C6-7 on the left. The patient tried conservative measures without relief. Pain was debilitating. Recommended ACDF with plating. Patient understood the risks, benefits, and alternatives and potential outcomes and wished to proceed.  PROCEDURE DETAILS: Patient was brought to the operating room placed under general endotracheal anesthesia. Patient was placed in the supine position on the operating room table. The neck was prepped with Duraprep and draped in a sterile fashion.   Three cc of local anesthesia was injected and a transverse incision was made on the right side of the neck.  Dissection was carried down thru the subcutaneous tissue and the platysma was  elevated, opened, and undermined with Metzenbaum scissors.  Dissection was then carried out thru an avascular plane leaving the sternocleidomastoid carotid artery and jugular vein laterally and the trachea and esophagus medially. The ventral aspect of the vertebral column was identified and a localizing x-ray was taken. The C4-5 level was identified. His body  habitus made for localization of the correct lower extremely difficult and we spent considerable time with all in the room looking at the films to make sure we were at the right level. I moved down to the next disc space level C5-6. The longus colli muscles were then elevated and the retractor was placed. The annulus was incised at C5-6 and C6-7 and the disc space entered. Discectomy was performed with micro-curettes and pituitary rongeurs. I then used the high-speed drill to drill the endplates down to the level of the posterior longitudinal ligament. The drill shavings were saved in a mucous trap for later arthrodesis. The operating microscope was draped and brought into the field provided additional magnification, illumination and visualization. Discectomy was continued posteriorly thru the disc space. Posterior longitudinal ligament was opened with a nerve hook, and then removed along with disc herniation and osteophytes, decompressing the spinal canal and thecal sac. We then continued to remove osteophytic overgrowth and disc material decompressing the neural foramina and exiting nerve roots bilaterally, but especially at C7 on the left and C6 on the right. The scope was angled up and down to help decompress and undercut the vertebral bodies, but again his body habitus made a hard undercut the upper vertebral bodies because I couldn't angle with a Kerrison as is typical to undercut those vertebral bodies. My hand would hit his chest and limit this angle. Once the decompression was completed we could pass a nerve hook circumferentially to assure adequate decompression in the midline and in the neural foramina. So by both visualization and palpation we felt we had an adequate decompression of the neural elements. We then measured the height of the intravertebral disc space and  selected an 8 millimeter PTi interbody cage packed with autograft. It was then gently positioned in the intravertebral disc space(s) and  countersunk. I then used a 34 mm Alphatec plate and placed variable angle screws into the vertebral bodies of each level and locked them into position. The wound was irrigated with bacitracin solution, checked for hemostasis which was established and confirmed. Once meticulous hemostasis was achieved, we then proceeded with closure. The platysma was closed with interrupted 3-0 undyed Vicryl suture, the subcuticular layer was closed with interrupted 3-0 undyed Vicryl suture. The skin edges were approximated with steristrips. The drapes were removed. A sterile dressing was applied. The patient was then awakened from general anesthesia and transferred to the recovery room in stable condition. At the end of the procedure all sponge, needle and instrument counts were correct.   PLAN OF CARE: Admit for overnight observation  PATIENT DISPOSITION:  PACU - hemodynamically stable.   Delay start of Pharmacological VTE agent (>24hrs) due to surgical blood loss or risk of bleeding:  yes

## 2018-01-14 NOTE — Transfer of Care (Signed)
Immediate Anesthesia Transfer of Care Note  Patient: Steven ClinesScott R Crosby  Procedure(s) Performed: ANTERIOR CERVICAL DECOMPRESSION AND FUSION CERVICAL FIVE-SIX,CERVICAL SIX-SEVEN (N/A Spine Cervical)  Patient Location: PACU  Anesthesia Type:General  Level of Consciousness: awake, patient cooperative and responds to stimulation  Airway & Oxygen Therapy: Patient Spontanous Breathing and Patient connected to face mask oxygen  Post-op Assessment: Report given to RN and Post -op Vital signs reviewed and stable  Post vital signs: Reviewed and stable  Last Vitals:  Vitals Value Taken Time  BP 124/67 01/14/2018 11:26 AM  Temp    Pulse 74 01/14/2018 11:32 AM  Resp 23 01/14/2018 11:32 AM  SpO2 98 % 01/14/2018 11:32 AM  Vitals shown include unvalidated device data.  Last Pain:  Vitals:   01/14/18 1126  TempSrc:   PainSc: (P) 0-No pain      Patients Stated Pain Goal: 3 (01/14/18 [id number])  Complications: No apparent anesthesia complications

## 2018-01-14 NOTE — H&P (Signed)
Subjective:   Patient is a 47 y.o. male admitted for acdf. The patient first presented to me with complaints of neck pain, shooting pains in the arm(s) and numbness of the arm(s). Onset of symptoms was several months ago. The pain is described as aching and occurs all day. The pain is rated severe, and is located in the neck and radiates to the arms. The symptoms have been progressive. Symptoms are exacerbated by extending head backwards, and are relieved by none.  Previous work up includes MRI of cervical spine, results: spinal stenosis.  Past Medical History:  Diagnosis Date  . Anxiety   . Arthritis   . Depression   . History of kidney stones   . Hypertension   . Pneumonia    times 2   . PONV (postoperative nausea and vomiting)   . Sleep apnea     Past Surgical History:  Procedure Laterality Date  . COLONOSCOPY    . ESOPHAGOGASTRODUODENOSCOPY    . GASTRIC BYPASS    . IRRIGATION AND DEBRIDEMENT SEBACEOUS CYST    . WISDOM TOOTH EXTRACTION      Allergies  Allergen Reactions  . Nsaids Other (See Comments)    Due to gastric bypass surgery    Social History   Tobacco Use  . Smoking status: Never Smoker  . Smokeless tobacco: Former Engineer, water Use Topics  . Alcohol use: No    Family History  Problem Relation Age of Onset  . High blood pressure Mother   . Thyroid disease Mother   . Diabetes Mother   . Heart attack Father   . CVA Father    Prior to Admission medications   Medication Sig Start Date End Date Taking? Authorizing Provider  acetaminophen (TYLENOL) 500 MG tablet Take 1,000 mg by mouth once as needed for moderate pain or headache.    Yes [provider]  busPIRone (BUSPAR) 10 MG tablet Take 10 mg by mouth daily.   Yes [provider]  CALCIUM-MAGNESIUM-ZINC PO Take 1 tablet by mouth daily.   Yes [provider]  Cholecalciferol (VITAMIN D3 PO) Take 1 tablet by mouth daily.   Yes [provider]  CRANBERRY PO Take 1 capsule  by mouth daily.   Yes [provider]  Cyanocobalamin (VITAMIN B 12 PO) Take 1 tablet by mouth daily.   Yes [provider]  escitalopram (LEXAPRO) 20 MG tablet Take 20 mg by mouth daily.   Yes [provider]  FIBER PO Take 1 tablet by mouth daily.   Yes [provider]  gabapentin (NEURONTIN) 300 MG capsule Take 300 mg by mouth 3 (three) times daily.   Yes [provider]  Glucosamine-Chondroitin (GLUCOSAMINE CHONDR COMPLEX PO) Take 1 tablet by mouth daily.   Yes [provider]  Hypromellose (ARTIFICIAL TEARS OP) Apply 1 drop to eye daily.   Yes [provider]  IRON PO Take 1 tablet by mouth daily.   Yes [provider]  loratadine (CLARITIN) 10 MG tablet Take 10 mg by mouth daily.   Yes [provider]  metoprolol (LOPRESSOR) 100 MG tablet Take 100 mg by mouth daily.    Yes [provider]  modafinil (PROVIGIL) 200 MG tablet Take 200 mg by mouth daily.   Yes [provider]  Multiple Vitamins-Minerals (MULTIVITAMIN ADULT PO) Take 1 tablet by mouth daily.   Yes [provider]  omeprazole (PRILOSEC) 20 MG capsule Take 20 mg by mouth daily.   Yes [provider]  OVER THE COUNTER MEDICATION Take 1 tablet by mouth daily. Tumeric   Yes [provider]  Probiotic Product (PROBIOTIC DAILY PO) Take 1 capsule by mouth daily.   Yes [provider]     Review of Systems  Positive ROS: neg  All other systems have been reviewed and were otherwise negative with the exception of those mentioned in the HPI and as above.  Objective: Vital signs in last 24 hours: Temp:  [98.4 F (36.9 C)] 98.4 F (36.9 C) (06/05 0704) Pulse Rate:  [70] 70 (06/05 0704) Resp:  [20] 20 (06/05 0704) BP: (122)/(61) 122/61 (06/05 0704) SpO2:  [97 %] 97 % (06/05 0704) Weight:  [174.2 kg (384 lb)] 174.2 kg (384 lb) (06/05 0722)  General Appearance: Alert, cooperative, no distress,  appears stated age Head: Normocephalic, without obvious abnormality, atraumatic Eyes: PERRL, conjunctiva/corneas clear, EOM's intact      Neck: Supple, symmetrical, trachea midline, Back: Symmetric, no curvature, ROM normal, no CVA tenderness Lungs:  respirations unlabored Heart: Regular rate and rhythm Abdomen: Soft, non-tender Extremities: Extremities normal, atraumatic, no cyanosis or edema Pulses: 2+ and symmetric all extremities Skin: Skin color, texture, turgor normal, no rashes or lesions  NEUROLOGIC:  Mental status: Alert and oriented x4, no aphasia, good attention span, fund of knowledge and memory  Motor Exam - grossly normal Sensory Exam - grossly normal Reflexes: 1+ Coordination - grossly normal Gait - grossly normal Balance - grossly normal Cranial Nerves: I: smell Not tested  II: visual acuity  OS: nl    OD: nl  II: visual fields Full to confrontation  II: pupils Equal, round, reactive to light  III,VII: ptosis None  III,IV,VI: extraocular muscles  Full ROM  V: mastication Normal  V: facial light touch sensation  Normal  V,VII: corneal reflex  Present  VII: facial muscle function - upper  Normal  VII: facial muscle function - lower Normal  VIII: hearing Not tested  IX: soft palate elevation  Normal  IX,X: gag reflex Present  XI: trapezius strength  5/5  XI: sternocleidomastoid strength 5/5  XI: neck flexion strength  5/5  XII: tongue strength  Normal    Data Review Lab Results  Component Value Date   WBC 8.9 01/09/2018   HGB 13.8 01/09/2018   HCT 42.0 01/09/2018   MCV 86.1 01/09/2018   PLT 292 01/09/2018   Lab Results  Component Value Date   NA 139 01/09/2018   K 4.1 01/09/2018   CL 107 01/09/2018   CO2 23 01/09/2018   BUN 15 01/09/2018   CREATININE 0.79 01/09/2018   GLUCOSE 82 01/09/2018   Lab Results  Component Value Date   INR 1.04 01/09/2018    Assessment:   Cervical neck pain with herniated nucleus pulposus/ spondylosis/ stenosis  at C5-7. Estimated body mass index is 50.66 kg/m as calculated from the following:   Height as of this encounter: 6\' 1"  (1.854 m).   Weight as of this encounter: 174.2 kg (384 lb).  Patient has failed conservative therapy. Planned surgery : acdf C5-6 C6-7  Morbid obesity  Plan:   I explained the condition and procedure to the patient and answered any questions.  Patient wishes to proceed with procedure as planned. Understands risks/ benefits/ and expected or typical outcomes.  JONES,DAVID S 01/14/2018 8:01 AM

## 2018-01-14 NOTE — Progress Notes (Signed)
Discharged instructions/education/AVS given to patient and he verbalized understanding, prescription was sent electronically by MD to patient's pharmacy of choice. MAe well, no numbness and no tingling on BUE as well as BLE. Patient is swallowing well with no complaints. No drainage, no swelling, no redness noted on incision site. Ambulating well indepedently. Voiding and emptying bladder well. Awaiting for wife for transport.

## 2018-01-16 NOTE — Anesthesia Postprocedure Evaluation (Signed)
Anesthesia Post Note  Patient: Ned ClinesScott R Goldwasser  Procedure(s) Performed: ANTERIOR CERVICAL DECOMPRESSION AND FUSION CERVICAL FIVE-SIX,CERVICAL SIX-SEVEN (N/A Spine Cervical)     Patient location during evaluation: PACU Anesthesia Type: General Level of consciousness: awake and alert Pain management: pain level controlled Vital Signs Assessment: post-procedure vital signs reviewed and stable Respiratory status: spontaneous breathing, nonlabored ventilation, respiratory function stable and patient connected to nasal cannula oxygen Cardiovascular status: blood pressure returned to baseline and stable Postop Assessment: no apparent nausea or vomiting Anesthetic complications: no    Last Vitals:  Vitals:   01/14/18 1712 01/14/18 1916  BP: 129/65 118/66  Pulse: 88 77  Resp: 18 20  Temp: 36.6 C 36.8 C  SpO2: 94% 94%    Last Pain:  Vitals:   01/14/18 1955  TempSrc:   PainSc: 6                  Darran Gabay

## 2018-01-17 ENCOUNTER — Encounter (HOSPITAL_COMMUNITY): Payer: Self-pay | Admitting: Neurological Surgery

## 2019-11-02 ENCOUNTER — Other Ambulatory Visit: Payer: Self-pay | Admitting: Neurological Surgery

## 2019-11-02 DIAGNOSIS — M5412 Radiculopathy, cervical region: Secondary | ICD-10-CM
